# Patient Record
Sex: Male | Born: 1970 | Race: White | Hispanic: No | Marital: Single | Smoking: Never smoker
Health system: Southern US, Community
[De-identification: ages and names within clinical notes are randomized; demographics above are authoritative.]

## PROBLEM LIST (undated history)

## (undated) ENCOUNTER — Ambulatory Visit: Admission: EM | Payer: Self-pay | Source: Home / Self Care

---

## 2008-10-06 ENCOUNTER — Ambulatory Visit: Payer: Self-pay | Admitting: Internal Medicine

## 2010-12-06 IMAGING — CR RIGHT THUMB 2+V
1 series · 3 of 3 positions shown · non-contrast
Comparison: none

REASON FOR EXAM: injury - meat slicer
COMMENTS:

[Series 1: view not recorded · 0.17mm/px · 3 of 3 slices shown]
[im 1/3]
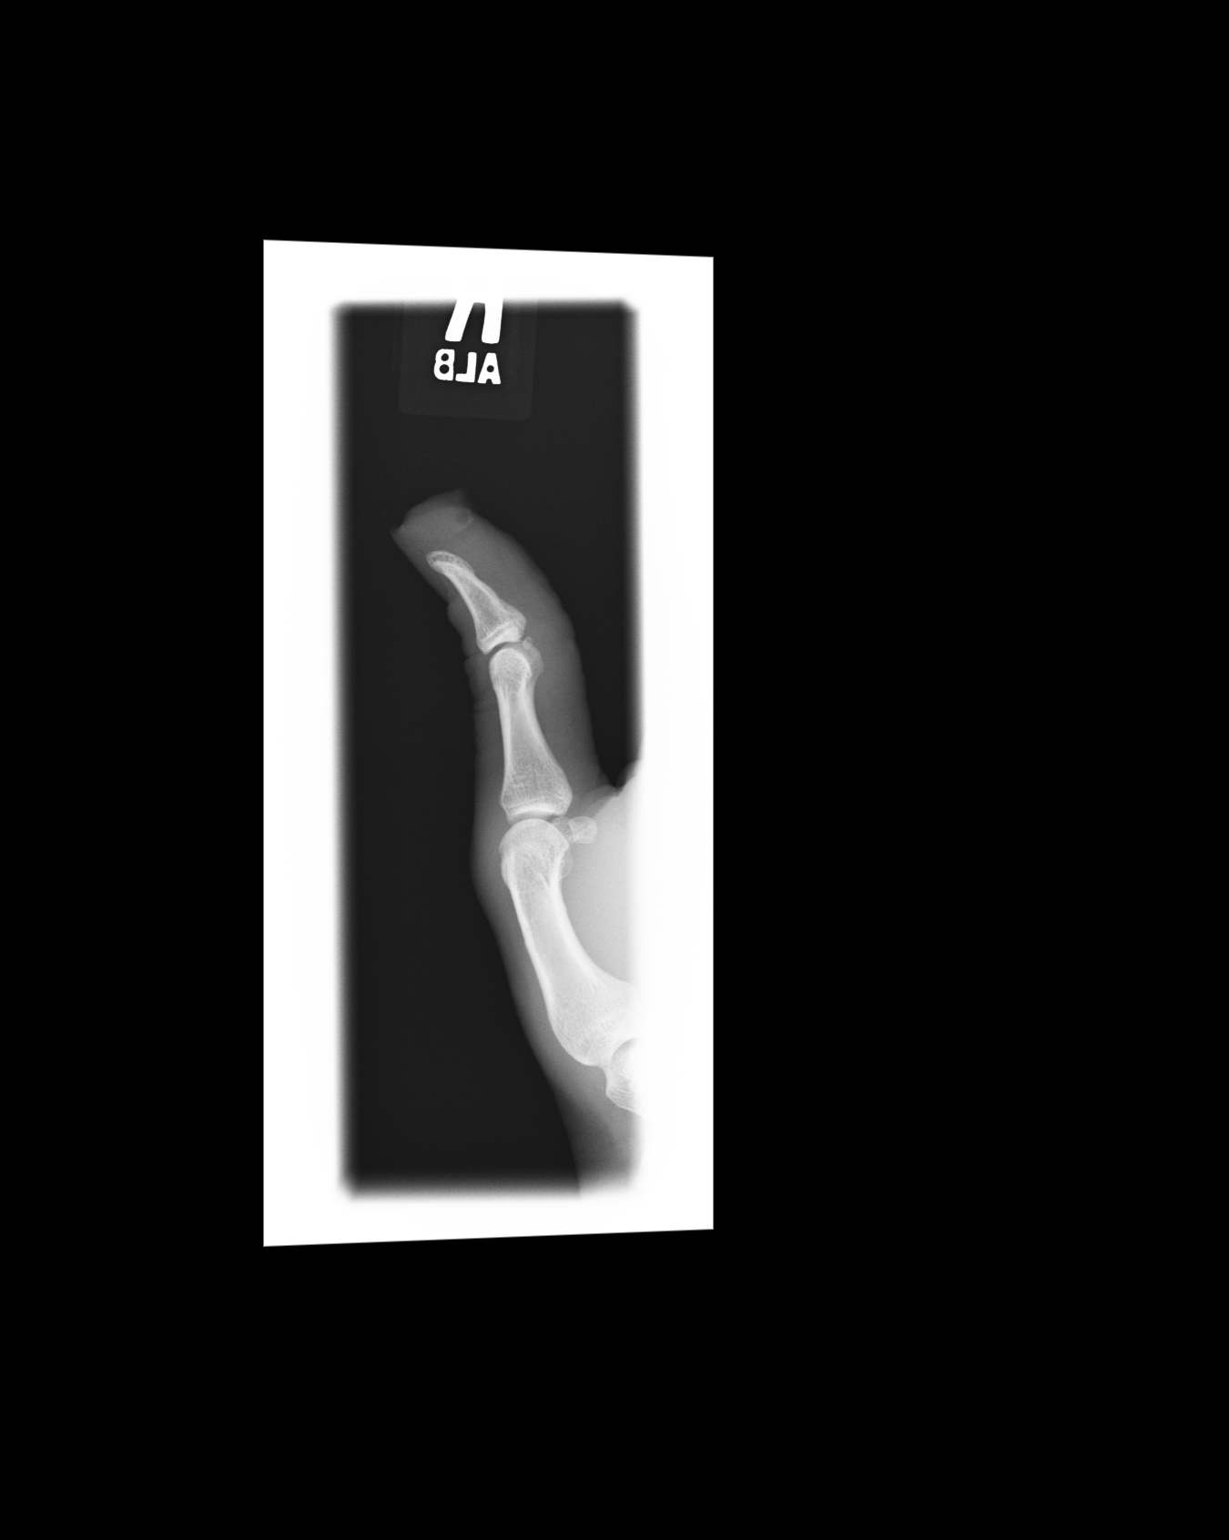
[im 2/3]
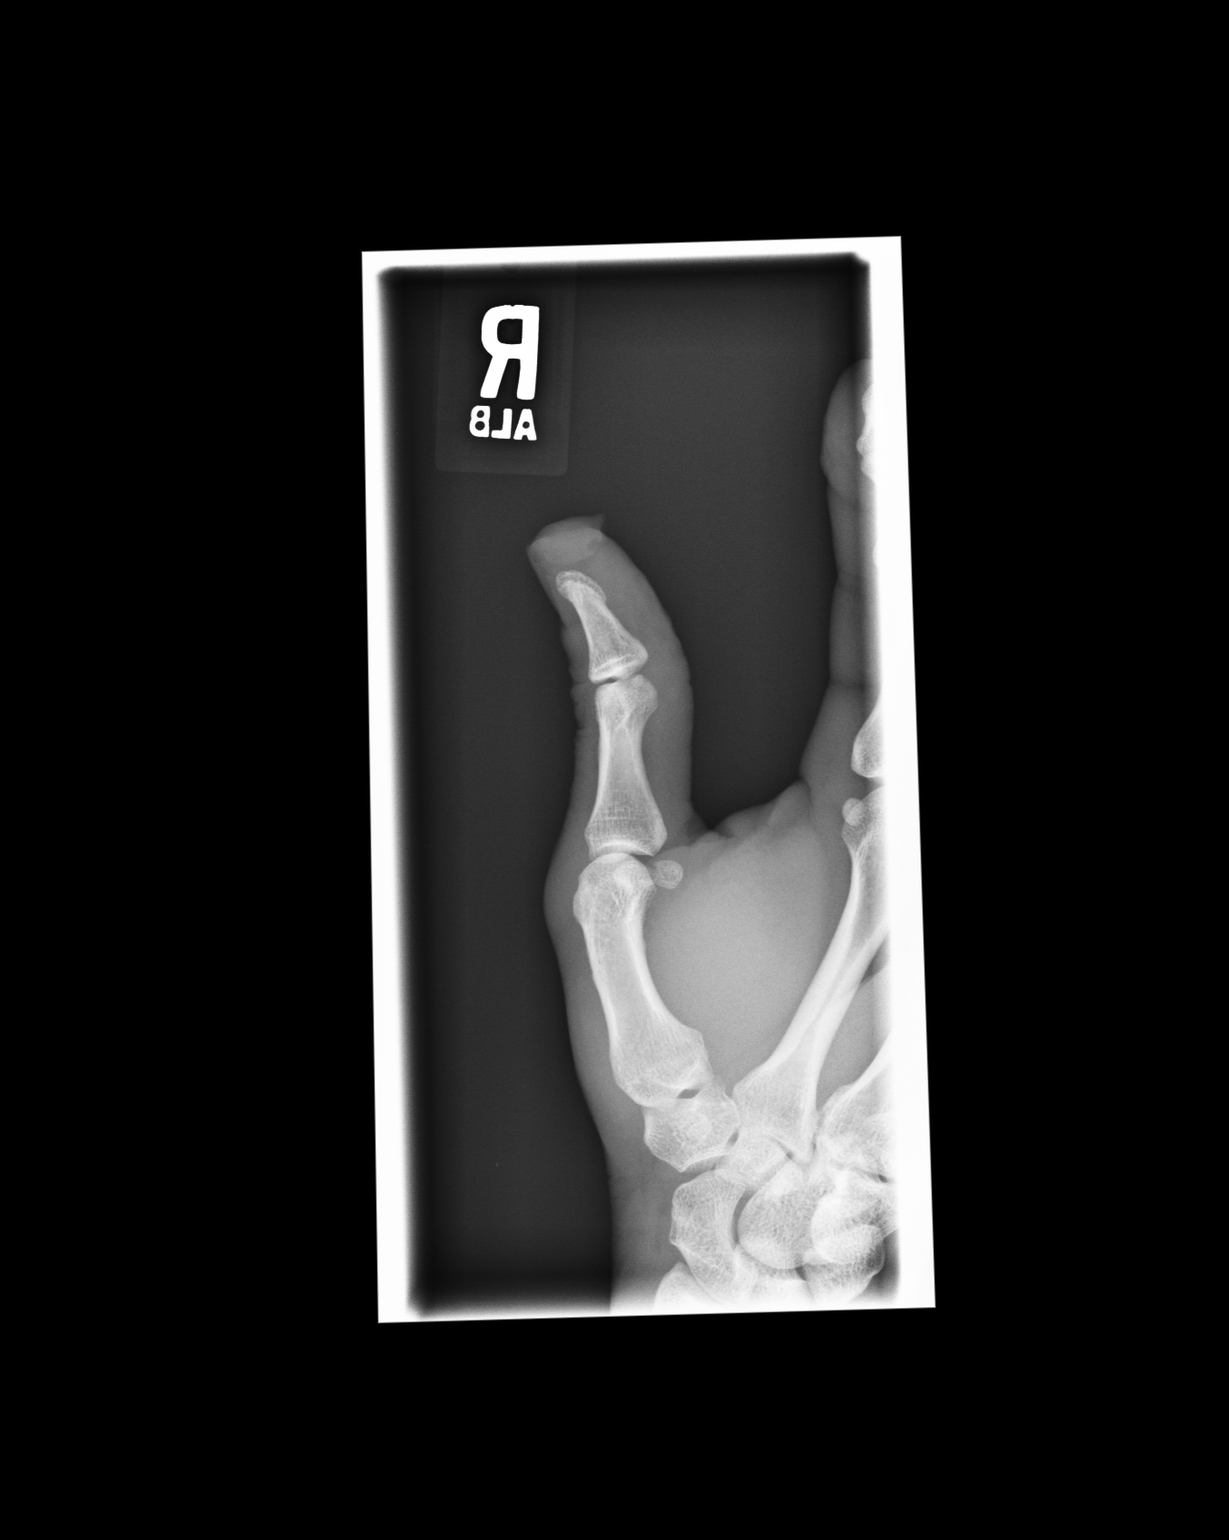
[im 3/3]
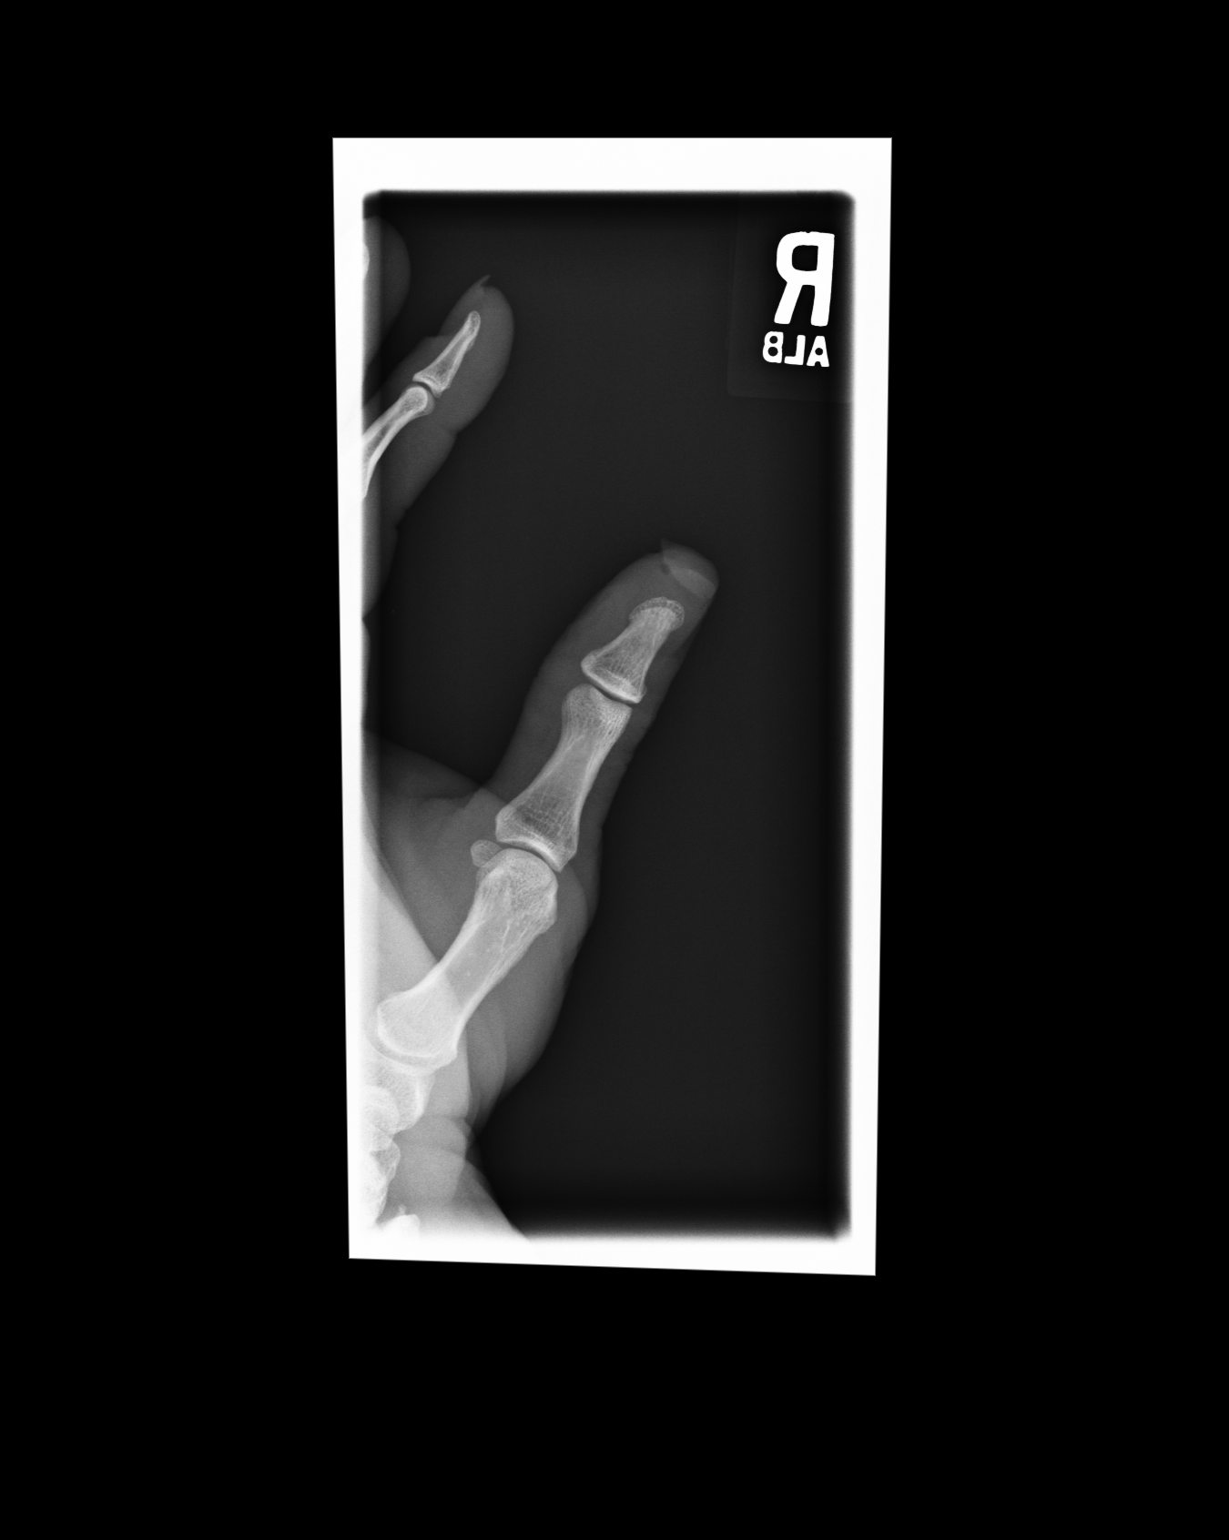

[3 of 3 positions shown; findings below may reference images not displayed]

PROCEDURE:     MDR - MDR THUMB RT HAND (1ST DIGIT)  - October 06, 2008 [DATE]

RESULT:     No fracture, dislocation or other acute bony abnormality is
identified. A bandage is present over the soft tissues of the distal portion
of the thumb. There is an apparent trace of gas in the soft tissues
compatible with the patient's history of recent penetrating injury.
IMPRESSION: 1. No acute bony abnormalities are identified.
2. No radiodense soft tissue foreign body is seen.

## 2022-05-07 ENCOUNTER — Ambulatory Visit
Admission: EM | Admit: 2022-05-07 | Discharge: 2022-05-07 | Disposition: A | Discharge status: Home / Self Care | Payer: No Typology Code available for payment source | Attending: Emergency Medicine | Admitting: Emergency Medicine

## 2022-05-07 ENCOUNTER — Ambulatory Visit (INDEPENDENT_AMBULATORY_CARE_PROVIDER_SITE_OTHER): Payer: No Typology Code available for payment source

## 2022-05-07 DIAGNOSIS — S61210A Laceration without foreign body of right index finger without damage to nail, initial encounter: Secondary | ICD-10-CM

## 2022-05-07 DIAGNOSIS — L03011 Cellulitis of right finger: Secondary | ICD-10-CM

## 2022-05-07 MED ORDER — TETANUS-DIPHTH-ACELL PERTUSSIS 5-2.5-18.5 LF-MCG/0.5 IM SUSY
0.5000 mL | PREFILLED_SYRINGE | Freq: Once | INTRAMUSCULAR | Status: AC
  Administered 2022-05-07: 0.5 mL via INTRAMUSCULAR

## 2022-05-07 MED ORDER — HYDROCODONE-ACETAMINOPHEN 5-325 MG PO TABS: 2.0000 | ORAL_TABLET | ORAL | 0 refills | Status: AC | PRN

## 2022-05-07 MED ORDER — DOXYCYCLINE HYCLATE 100 MG PO CAPS: 100.0000 mg | ORAL_CAPSULE | Freq: Two times a day (BID) | ORAL | 0 refills | Status: AC

## 2022-05-07 MED ORDER — CEFTRIAXONE SODIUM 1 G IJ SOLR
1.0000 g | Freq: Once | INTRAMUSCULAR | Status: AC
  Administered 2022-05-07: 1 g via INTRAMUSCULAR

## 2022-05-07 NOTE — ED Triage Notes (Signed)
Pt presents to UC for finger laceration to RT index finger DOI: 05/04/22, pt states he cut finger with piece of metal from lawn mower. Pt states swelling in RT hand started the next day, pus from cut. No fevers, last tetanus unknown

## 2022-05-07 NOTE — ED Provider Notes (Signed)
[facility]    CSN: [id number] Arrival date & time: 05/07/22  1419      History   Chief Complaint Chief Complaint  Patient presents with   Laceration    RT index finger     HPI Erik Byrd is a 52 y.o. male.   HPI  52 year old male here for evaluation of laceration to the left index finger.  He has no significant past medical history and does not go to the doctor.  He has no idea when his last tetanus shot was.  He reports that he cut the knuckle of the PIP joint on a lawnmower blade and the next day and noticed redness and swelling.  This is continued and then today he noticed that there was yellow pus coming from the wound.  He does have some numbness in his finger and decreased range of motion secondary to the swelling.  History reviewed. No pertinent past medical history.  There are no problems to display for this patient.   History reviewed. No pertinent surgical history.     Home Medications    Prior to Admission medications   Medication Sig Start Date End Date Taking? Authorizing Provider  doxycycline (VIBRAMYCIN) 100 MG capsule Take 1 capsule (100 mg total) by mouth 2 (two) times daily. 05/07/22  Yes Margarette Canada, NP  HYDROcodone-acetaminophen (NORCO/VICODIN) 5-325 MG tablet Take 2 tablets by mouth every 4 (four) hours as needed. 05/07/22  Yes Margarette Canada, NP    Family History History reviewed. No pertinent family history.  Social History Social History   Tobacco Use   Smoking status: Never   Smokeless tobacco: Never  Vaping Use   Vaping Use: Never used  Substance Use Topics   Alcohol use: Not Currently   Drug use: Never     Allergies   Patient has no known allergies.   Review of Systems Review of Systems  Constitutional:  Negative for fever.  Musculoskeletal:  Positive for arthralgias, joint swelling and myalgias.  Skin:  Positive for color change and wound.  Neurological:  Positive for numbness.     Physical Exam Triage  Vital Signs ED Triage Vitals [05/07/22 1452]  Enc Vitals Group     BP      Pulse      Resp      Temp      Temp src      SpO2      Weight 150 lb (68 kg)     Height 5\' 9"  (1.753 m)     Head Circumference      Peak Flow      Pain Score      Pain Loc      Pain Edu?      Excl. in [place]?    No data found.  Updated Vital Signs BP (!) 166/102 (BP Location: Right Arm)   Pulse 90   Temp 98.4 F (36.9 C) (Oral)   Ht 5\' 9"  (1.753 m)   Wt 150 lb (68 kg)   SpO2 95%   BMI 22.15 kg/m   Visual Acuity Right Eye Distance:   Left Eye Distance:   Bilateral Distance:    Right Eye Near:   Left Eye Near:    Bilateral Near:     Physical Exam Vitals and nursing note reviewed.  Constitutional:      Appearance: Normal appearance. He is not ill-appearing.  HENT:     Head: Normocephalic and atraumatic.  Musculoskeletal:  General: Swelling, tenderness and signs of injury present. No deformity.  Skin:    General: Skin is warm and dry.     Capillary Refill: Capillary refill takes less than 2 seconds.     Findings: Erythema present.  Neurological:     Mental Status: He is alert.      UC Treatments / Results  Labs (all labs ordered are listed, but only abnormal results are displayed) Labs Reviewed - No data to display  EKG   Radiology DG Finger Index Right  Result Date: 05/07/2022 CLINICAL DATA:  Laceration, soft tissue swelling EXAM: RIGHT INDEX FINGER 2+V COMPARISON:  None Available. FINDINGS: Frontal, oblique, and lateral views of the right second digit are obtained. There is marked soft tissue swelling, greatest at the level of the proximal interphalangeal joint. No evidence of subcutaneous gas or radiopaque foreign body. There are no acute or destructive bony abnormalities. Joint spaces are well preserved. Alignment is anatomic. IMPRESSION: 1. Marked soft tissue swelling of the second digit. 2. No fracture or radiopaque foreign body. 3. No evidence of osteomyelitis.  Electronically Signed   By: Randa Ngo M.D.   On: 05/07/2022 15:15    Procedures Procedures (including critical care time)  Medications Ordered in UC Medications  Tdap (BOOSTRIX) injection 0.5 mL (0.5 mLs Intramuscular Given 05/07/22 1519)  cefTRIAXone (ROCEPHIN) injection 1 g (1 g Intramuscular Given 05/07/22 1521)    Initial Impression / Assessment and Plan / UC Course  I have reviewed the triage vital signs and the nursing notes.  Pertinent labs & imaging results that were available during my care of the patient were reviewed by me and considered in my medical decision making (see chart for details).   Patient is a nontoxic-appearing 52 year old male presenting for evaluation of laceration to right index finger over the dorsal aspect of the PIP joint.    Patient seen images above, the redness extends to the proximal aspect of the middle finger as well as to the back of the hand up to the level of the wrist.  There is yellow pus exuding from the wound.  It is warm to touch but there is no fluctuance to indicate a fluid collection.  He is unable to flex his finger secondary to the swelling but he does have full sensation in the tip of his finger.  I will order his tetanus to be updated as well as administer 1 g of IM ceftriaxone here in clinic and discharged him home on doxycycline.  He is to keep the wound clean and dry.  I have marked the area of redness with a skin pen and dated.  If the redness extends beyond the line, he has an increase in swelling, or he develops fever he needs to go to the ER for evaluation.  I will also order radiograph of the right index finger to look for any retained foreign bodies or signs of inflammatory response to the bones of the finger.  Radiology impression states that there is no evidence of osteomyelitis but there is marked soft tissue swelling.  No retained foreign bodies.  I will discharge patient home with a diagnosis of cellulitis of the right index  finger and hand and start him on doxycycline 100 mg twice daily for 10 days.  I will also encourage him to soak his finger in warm water and Epsom salts and keep the wound clean and dry.  If he has any increase in redness, swelling, develops red streaks, or fever he  needs to go to the ER for evaluation.  I will also discharge patient home with a short prescription for Norco.  He has no open prescription sent in PDMP.   Final Clinical Impressions(s) / UC Diagnoses   Final diagnoses:  Laceration of right index finger without foreign body without damage to nail, initial encounter  Cellulitis of index finger, right     Discharge Instructions      Keep your finger laceration clean and dry and covered with a bandage until the drainage is stopped and the scab is formed.  Remove the bandage daily, when he gets soiled, and wash the wound with warm water and soap.  You may also soak your finger in warm water and Epsom salts to help promote drainage of the infection through the open wound.  Do not use hydroperoxide.  Take the doxycycline twice daily with food for 10 days for treatment of your infected wound.  Use over-the-counter Tylenol and/or ibuprofen according to the package instructions as needed for mild to moderate pain.  You may use the Norco as needed for severe pain.  If you develop any increase in redness, swelling, develop red streaks going up your arm, increased pus drainage, or fever you need to go to the ER for evaluation.     ED Prescriptions     Medication Sig Dispense Auth. Provider   doxycycline (VIBRAMYCIN) 100 MG capsule Take 1 capsule (100 mg total) by mouth 2 (two) times daily. 20 capsule Margarette Canada, NP   HYDROcodone-acetaminophen (NORCO/VICODIN) 5-325 MG tablet Take 2 tablets by mouth every 4 (four) hours as needed. 6 tablet Margarette Canada, NP      I have reviewed the PDMP during this encounter.   Margarette Canada, NP 05/07/22 1526

## 2022-05-07 NOTE — Discharge Instructions (Addendum)
Keep your finger laceration clean and dry and covered with a bandage until the drainage is stopped and the scab is formed.  Remove the bandage daily, when he gets soiled, and wash the wound with warm water and soap.  You may also soak your finger in warm water and Epsom salts to help promote drainage of the infection through the open wound.  Do not use hydroperoxide.  Take the doxycycline twice daily with food for 10 days for treatment of your infected wound.  Use over-the-counter Tylenol and/or ibuprofen according to the package instructions as needed for mild to moderate pain.  You may use the Norco as needed for severe pain.  If you develop any increase in redness, swelling, develop red streaks going up your arm, increased pus drainage, or fever you need to go to the ER for evaluation.
